# Patient Record
Sex: Male | Born: 1990 | Race: White | Hispanic: No | Marital: Single | Smoking: Never smoker
Health system: Southern US, Community
[De-identification: ages and names within clinical notes are randomized; demographics above are authoritative.]

---

## 2011-10-21 ENCOUNTER — Encounter (HOSPITAL_COMMUNITY): Payer: Self-pay

## 2011-10-21 ENCOUNTER — Emergency Department (HOSPITAL_COMMUNITY)
Admission: EM | Admit: 2011-10-21 | Discharge: 2011-10-21 | Disposition: A | Discharge status: Home / Self Care | Payer: Self-pay | Attending: Emergency Medicine | Admitting: Emergency Medicine

## 2011-10-21 DIAGNOSIS — L0390 Cellulitis, unspecified: Secondary | ICD-10-CM

## 2011-10-21 DIAGNOSIS — R21 Rash and other nonspecific skin eruption: Secondary | ICD-10-CM | POA: Insufficient documentation

## 2011-10-21 DIAGNOSIS — M7989 Other specified soft tissue disorders: Secondary | ICD-10-CM | POA: Insufficient documentation

## 2011-10-21 LAB — CBC WITH DIFFERENTIAL/PLATELET
Eosinophils Absolute: 0.2 10*3/uL (ref 0.0–0.7)
Lymphocytes Relative: 17 % (ref 12–46)
Lymphs Abs: 1.2 10*3/uL (ref 0.7–4.0)
Neutro Abs: 4.9 10*3/uL (ref 1.7–7.7)
Neutrophils Relative %: 69 % (ref 43–77)
Platelets: 228 10*3/uL (ref 150–400)
RBC: 5.07 MIL/uL (ref 4.22–5.81)
WBC: 7.2 10*3/uL (ref 4.0–10.5)

## 2011-10-21 LAB — COMPREHENSIVE METABOLIC PANEL
ALT: 26 U/L (ref 0–53)
Alkaline Phosphatase: 92 U/L (ref 39–117)
CO2: 25 mEq/L (ref 19–32)
GFR calc Af Amer: >90 mL/min (ref >90)
Glucose, Bld: 88 mg/dL (ref 70–99)
Potassium: 3.6 mEq/L (ref 3.5–5.1)
Sodium: 133 mEq/L — ABNORMAL LOW (ref 135–145)
Total Protein: 7.7 g/dL (ref 6.0–8.3)

## 2011-10-21 MED ORDER — VANCOMYCIN HCL IN DEXTROSE 1-5 GM/200ML-% IV SOLN
1000.0000 mg | Freq: Once | INTRAVENOUS | Status: AC
  Administered 2011-10-21: 1000 mg via INTRAVENOUS
  Filled 2011-10-21: qty 200

## 2011-10-21 MED ORDER — SULFAMETHOXAZOLE-TRIMETHOPRIM 800-160 MG PO TABS: 14.0000 | ORAL_TABLET | Freq: Two times a day (BID) | ORAL | Status: DC

## 2011-10-21 MED ORDER — SULFAMETHOXAZOLE-TRIMETHOPRIM 800-160 MG PO TABS: 1.0000 | ORAL_TABLET | Freq: Two times a day (BID) | ORAL | Status: AC

## 2011-10-21 NOTE — ED Notes (Signed)
Pt reports that he has been working out and now left leg is swollen x1 week

## 2011-10-21 NOTE — Discharge Instructions (Signed)
Follow up in 2-3 days for recheck °

## 2011-10-21 NOTE — ED Notes (Signed)
Pt c/o left leg pain after working out Thursday.

## 2011-10-21 NOTE — ED Provider Notes (Signed)
History  This chart was scribed for Thomas Lennert, MD by Thomas Coleman. This patient was seen in room APA07/APA07 and the patient's care was started at 0641.   CSN: [id number]  Arrival date & time 10/21/11  0641   None     Chief Complaint  Patient presents with  . Leg Swelling   Patient is a 21 y.o. male presenting with rash. The history is provided by the patient. No language interpreter was used.  Rash  This is a new problem. The current episode started more than 2 days ago. The problem has been gradually worsening. The problem is associated with nothing. There has been no fever. The rash is present on the left ankle and left lower leg. The pain is mild. The pain has been constant since onset. Pertinent negatives include no itching and no pain. He has tried nothing for the symptoms.   Thomas Coleman is a 21 y.o. male who presents to the Emergency Department complaining of constant gradually worsening non painful, erythematous, left lower leg swelling for 5 days. He is unsure if he was stung/bitten by anything but states he just began a new workout class about 5 days ago when his symptoms began. His swelling extends from below his knee with redness to his left ankle. He has not tried any medicines for this problem. Nothing seems to make it better or worse.   He has no PCP  History reviewed. No pertinent past medical history.  History reviewed. No pertinent past surgical history.  No family history on file.  History  Substance Use Topics  . Smoking status: Never Smoker   . Smokeless tobacco: Not on file  . Alcohol Use: No      Review of Systems  Constitutional: Negative for fever and chills.  Respiratory: Negative for shortness of breath.   Gastrointestinal: Negative for nausea and vomiting.  Skin: Positive for rash. Negative for itching.       Left lower leg swelling from knee to ankle  Neurological: Negative for weakness.  All other systems reviewed and are  negative.    Allergies  Review of patient's allergies indicates no known allergies.  Home Medications  No current outpatient prescriptions on file.  Triage Vitals: BP 126/83  Pulse 96  Temp 98.1 F (36.7 C) (Oral)  Resp 20  Ht 6\' 5"  (1.956 m)  SpO2 96%  Physical Exam  Nursing note and vitals reviewed. Constitutional: He is oriented to person, place, and time. He appears well-developed.  HENT:  Head: Normocephalic.  Eyes: Conjunctivae normal are normal.  Neck: Neck supple. No tracheal deviation present.  Cardiovascular:  No murmur heard. Pulmonary/Chest: Effort normal and breath sounds normal. No respiratory distress. He has no wheezes. He has no rales.  Musculoskeletal: Normal range of motion.  Neurological: He is oriented to person, place, and time.  Skin: Skin is warm. There is erythema.       Left lower leg below knee area about 5 cm in diameter with erythema, induration, and mildly tender. Swelling extending to lower part of tibular fibular region.     Psychiatric: He has a normal mood and affect. His behavior is normal.    ED Course  Procedures (including critical care time) DIAGNOSTIC STUDIES: Oxygen Saturation is 96% on room air, adequate by my interpretation.    COORDINATION OF CARE: At 755 AM Discussed treatment plan with patient which includes vancomycin, and blood work. Patient agrees.   Labs Reviewed - No data to display  No results found.   No diagnosis found.    MDM  The chart was scribed for me under my direct supervision.  I personally performed the history, physical, and medical decision making and all procedures in the evaluation of this patient.Thomas Lennert, MD 10/21/11 1003

## 2013-12-16 ENCOUNTER — Emergency Department (HOSPITAL_COMMUNITY): Payer: BC Managed Care – PPO

## 2013-12-16 ENCOUNTER — Emergency Department (HOSPITAL_COMMUNITY)
Admission: EM | Admit: 2013-12-16 | Discharge: 2013-12-16 | Disposition: A | Discharge status: Home / Self Care | Payer: BC Managed Care – PPO | Attending: Emergency Medicine | Admitting: Emergency Medicine

## 2013-12-16 ENCOUNTER — Encounter (HOSPITAL_COMMUNITY): Payer: Self-pay | Admitting: Emergency Medicine

## 2013-12-16 DIAGNOSIS — R002 Palpitations: Secondary | ICD-10-CM | POA: Diagnosis present

## 2013-12-16 DIAGNOSIS — Z792 Long term (current) use of antibiotics: Secondary | ICD-10-CM | POA: Diagnosis not present

## 2013-12-16 LAB — I-STAT CHEM 8, ED
BUN: 14 mg/dL (ref 6–23)
CHLORIDE: 102 meq/L (ref 96–112)
CREATININE: 0.8 mg/dL (ref 0.50–1.35)
Calcium, Ion: 1.17 mmol/L (ref 1.12–1.23)
GLUCOSE: 99 mg/dL (ref 70–99)
HEMATOCRIT: 47 % (ref 39.0–52.0)
HEMOGLOBIN: 16 g/dL (ref 13.0–17.0)
POTASSIUM: 4.1 meq/L (ref 3.7–5.3)
SODIUM: 139 meq/L (ref 137–147)
TCO2: 26 mmol/L (ref 0–100)

## 2013-12-16 LAB — RAPID URINE DRUG SCREEN, HOSP PERFORMED
Amphetamines: NOT DETECTED
Barbiturates: NOT DETECTED
Benzodiazepines: NOT DETECTED
COCAINE: NOT DETECTED
OPIATES: NOT DETECTED
Tetrahydrocannabinol: NOT DETECTED

## 2013-12-16 LAB — I-STAT TROPONIN, ED: Troponin i, poc: 0.01 ng/mL (ref 0.00–0.08)

## 2013-12-16 NOTE — ED Notes (Signed)
Pt reports palpitations since last night. Pt denies any cp,sob. nad noted.

## 2013-12-16 NOTE — Discharge Instructions (Signed)
°Emergency Department Resource Guide °1) Find a Doctor and Pay Out of Pocket °Although you won't have to find out who is covered by your insurance plan, it is a good idea to ask around and get recommendations. You will then need to call the office and see if the doctor you have chosen will accept you as a new patient and what types of options they offer for patients who are self-pay. Some doctors offer discounts or will set up payment plans for their patients who do not have insurance, but you will need to ask so you aren't surprised when you get to your appointment. ° °2) Contact Your Local Health Department °Not all health departments have doctors that can see patients for sick visits, but many do, so it is worth a call to see if yours does. If you don't know where your local health department is, you can check in your phone book. The CDC also has a tool to help you locate your state's health department, and many state websites also have listings of all of their local health departments. ° °3) Find a Walk-in Clinic °If your illness is not likely to be very severe or complicated, you may want to try a walk in clinic. These are popping up all over the country in pharmacies, drugstores, and shopping centers. They're usually staffed by nurse practitioners or physician assistants that have been trained to treat common illnesses and complaints. They're usually fairly quick and inexpensive. However, if you have serious medical issues or chronic medical problems, these are probably not your best option. ° °No Primary Care Doctor: °- Call [facility] at  832-8000 - they can help you locate a primary care doctor that  accepts your insurance, provides certain services, etc. °- Physician Referral Service- 1-800-533-3463 ° °Chronic Pain Problems: °Organization         Address  Phone   Notes  °[facility]  (336) 297-2271 Patients need to be referred by their primary care doctor.  ° °Medication  Assistance: °Organization         Address  Phone   Notes  °[place] [facility] [address] °[place], [place] [address] (336) 641-8030 --Must be a resident of [place] °-- Must have NO insurance coverage whatsoever (no Medicaid/ Medicare, etc.) °-- The pt. MUST have a primary care doctor that directs their care regularly and follows them in the community °  °[facility]  (866) 331-1348   °[employer]  (888) 892-1162   ° °Agencies that provide inexpensive medical care: °Organization         Address  Phone   Notes  °[facility]  (336) 832-8035   °[facility]    (336) 832-7272   °[facility] [address] °[place], [place] [address] (336) 832-4777   °[facility] [address], °[place] (336) 271-4999   °[facility]    (336) 373-0678   °[facility]    (336) 272-1050   °[facility] ° [address], [place] Phone:  (336) 832-4444, Fax:  (336) 832-4440 Hours of Operation:  9 am - 6 pm, M-F.  Also accepts Medicaid/Medicare and self-pay.  °[facility] ° [address], [place] Phone: (336) 832-3150, Fax: (336) 832-3151. Hours of Operation:  8:30 am - 5:30 pm, M-F.  Also accepts Medicaid and self-pay.  °[facility] [place] [address], [place] Phone: (336) 878-6027   °[facility] [address], [place], [place] (336)723-1848, Ext. 123 Mondays & Thursdays: 7-9 AM.  First 15 patients are seen on a first come, first serve basis. °  ° °Medicaid-accepting [place] Providers: ° °Organization         Address  Phone   Notes  °[facility] [address], [place] (336) 641-2100 Also accepts self-pay patients.  °[facility] [address], [place] ° (336) 856-9996   °[facility] [address], [place]  (336) 288-8857   °[facility] [address] [place] [address], [place] (336) 299-7000   °Veita Bland [address], [place]  ° (336) 373-1557 Only accepts [place] Access Medicaid patients after they have their name applied to their card.  ° °Self-Pay (no insurance) in [place]: ° °Organization         Address  Phone   Notes  °Sickle Cell Patients, [facility] [address], [place] (336) 832-1970   °[facility] [address], [place] (336) 832-4400   °[facility] ° [address], [place] (336) 992-4800   °[facility]/Dr. Osei-Bonsu ° [address] [place] [address], [place] or [address], [place] (336) 841-8500 Phone number for both [place] and [place] locations is the same.  °[facility] [address], [place] (336) 299-0000   °[facility] [address] [place] [address], [place] or [address] (336) 852-7530 °(336) 878-2260   °[facility] [address], [place] (336) 350-1642, phone; (336) 294-5005, fax Sees patients 1st and 3rd Saturday of every month.  Must not qualify for public or private insurance (i.e. Medicaid, Medicare, [place] Health Choice, Veterans' Benefits) • Household income should be no more than 200% of the poverty level •The clinic cannot treat you if you are pregnant or think you are pregnant • Sexually transmitted diseases are not treated at the clinic.  ° ° °Dental Care: °Organization         Address  Phone  Notes  °[place] [facility] [address], [place] (336) 641-6152 Accepts children up to age 21 who are enrolled in Medicaid or [place] Health Choice; pregnant women with a Medicaid card; and children who have applied for Medicaid or [place] Health Choice, but were declined, whose parents can pay a reduced fee at time of service.  °[place]  [facility] [place]  [address], [place] (336) 641-7733 Accepts children up to age 21 who are enrolled in Medicaid or [place] Health Choice; pregnant women with a Medicaid card; and children who have applied for Medicaid or [place] Health Choice, but were declined, whose parents can pay a reduced fee at time of service.  °[facility] ° [address], [place] (336) 641-4533 Patients are seen by appointment only. Walk-ins are not accepted. [facility] will see patients 18 years of age and older. °Monday - Tuesday (8am-5pm) °Most Wednesdays (8:30-5pm) °$30 per visit, cash only  °[facility] ° [address], [place] (336) 641-4533 Patients are seen by appointment only. Walk-ins are not accepted. [facility] will see patients 18 years of age and older. °One   Wednesday Evening (Monthly: Volunteer Based).  $30 per visit, cash only  °[facility]  (919) 537-3737 for adults; Children under age 4, call Graduate Pediatric Dentistry at (919) [phone]. Children aged 4-14, please call (919) [phone] to request a pediatric application. ° Dental services are provided in all areas of dental care including fillings, crowns and bridges, complete and partial dentures, implants, gum treatment, root canals, and extractions. Preventive care is also provided. Treatment is provided to both adults and children. °Patients are selected via a lottery and there is often a waiting list. °  °[facility] [address], °[place] ° (336) 763-8833 [email or web address] °  °[facility] [address], [place], [place] (336)723-1848, Ext. 123 Second and Fourth Thursday of each month, opens at 6:30 AM; Clinic ends at 9 AM.  Patients are seen on a first-come first-served basis, and a limited number are seen during each clinic.  ° °[facility] ° [address], [place], [place] (336) 723-7904    Eligibility Requirements °You must have lived in [place], [place], or [place] counties for at least the last three months. °  You cannot be eligible for state or federal sponsored healthcare insurance, including Veterans Administration, Medicaid, or Medicare. °  You generally cannot be eligible for healthcare insurance through your employer.  °  How to apply: °Eligibility screenings are held every Tuesday and Wednesday afternoon from 1:00 pm until 4:00 pm. You do not need an appointment for the interview!  °[facility] [address], [place], [place] 336-631-2330   °[facility]  336-342-8273   °[facility]  336-703-3100   °[facility]  336-570-6415   ° °Behavioral Health Resources in the Community: °Intensive Outpatient Programs °Organization         Address  Phone  Notes  °[facility] [address], [place], [place] 336-878-6098   °[facility] [address], [place], [place] 336-832-9800   °[facility] [address], [place], [place] ° 336-882-2125   °[facility] [address],  °[place], [place] 1-800-853-5163 or 336-641-4981   °Substance Abuse Resources °Organization         Address  Phone  Notes  °[facility]  336-882-2125   °[facility]  336-784-9470   °[facility]  336-285-9073   °[facility]  336-845-3988   °Residential & Outpatient Substance Abuse Program  1-800-659-3381   °Psychological Services °Organization         Address  Phone  Notes  °[facility]  336- 832-9600   °[facility]  336- 378-7881   °[facility] [address], [place] 1-800-853-5163 or 336-641-4981   ° °Mobile Crisis Teams °Organization         Address  Phone  Notes  °[facility], Mobile Crisis Care Unit  1-877-626-1772   °Assertive °[facility] ° [address]  [place], [place] 336-834-9664   °Sharon DeEsch [address] °[place] 336-554-5454   ° °Self-Help/Support Groups °Organization         Address  Phone             Notes  °[facility]  - variety of support groups  336- 373-1402 Call for more information  °Narcotics Anonymous (NA), [facility] [address], °[place]  2 meetings at this location  ° °  Residential Treatment Programs Organization         Address  Phone  Notes  [facility] [address],    [place]  [phone]   [facility]  [address], [place] [phone], [place], [place] [phone]   [facility] [address], [place] [phone] Admissions: 8am-3pm M-F  [facility] [address]    [place], [place] [phone]   [facility] [address], [place], [place] [phone]   [facility] [address]  [place], [place] [phone]   [facility] Intensive Outpatient [address], [place], [place] [phone]   [facility] ([facility] [address]  [place], [place] [phone]-(606)[phone] or [phone]   [facility] [address] [place], [place] [phone]) [phone] Accepts Medicaid  [facility] [address]  [place] [phone] Substance Abuse/Addiction Treatment   [facility]         Address  Phone  Notes  [facility]  (606) 233-6300(888) [phone]   Angie FavaJulie Brannon, PhD [address], [address] [place], [place]   [phone](336) (737)[phone] or [phone](336) [phone]   [facility]   [address] [place], [place] [phone](336) (925)[phone]   [facility] [address], [place], [place] [phone](336) (210) [phone] Insurance/Medicaid/sponsorship through [facility] [address]                                    [place], [place] [phone](336) (210) [phone] Therapy/tele-psych/case    [facility] [address]   [place], [place] ([phone]) [phone](336) ([phone]    Dr. Lolly MustacheArfeen  [phone](336) [phone]   [facility]  [facility] 1) [address], [place] 2) [address], [place] 3)  [address], [place] [phone](336) [phone](336) [phone]  [phone](336) [phone]   [facility] Child Abuse Hotline (445)390-0556(336) 817-086-4813 or (954)643-2603(336) (640)[phone] (After Hours)      Avoid avoid caffinated products, such as teas, colas, coffee, chocolate. Avoid over the counter cold medicines, herbal or "natural vitamin" products, and illicit drugs because they can contain stimulants. Try to take your heart rate when you have symptoms. Keep a diary of your symptoms and heart rate to show your doctor at your next office visit.  Call the Cardiologist on Monday to schedule a follow up appointment next week.  Return to the Emergency Department immediately if worsening.

## 2013-12-16 NOTE — ED Provider Notes (Signed)
CSN: [id number]     Arrival date & time 12/16/13  1357 History   First MD Initiated Contact with Patient 12/16/13 1458     Chief Complaint  Patient presents with  . Palpitations      HPI Pt was seen at 1505. Per pt, c/o gradual onset and persistence of multiple intermittent episodes of "palpitations" since yesterday. Pt states he drank a large pepsi max before going to bed last night, then felt "my heart speeding up and slowing down" after he laid down to try to sleep. Pt states this continued today [facility] States they told him his HR was "going from 80 to 120." Pt states was "feeling really anxious" at that time. States he feels "a little better" since coming to the ED for evaluation. Denies CP/SOB, no cough, no abd pain, no N/V/D, no back pain, no syncope/near syncope.    History reviewed. No pertinent past medical history.   History reviewed. No pertinent past surgical history.  History  Substance Use Topics  . Smoking status: Never Smoker   . Smokeless tobacco: Not on file  . Alcohol Use: No    Review of Systems ROS: Statement: All systems negative except as marked or noted in the HPI; Constitutional: Negative for fever and chills. ; ; Eyes: Negative for eye pain, redness and discharge. ; ; ENMT: Negative for ear pain, hoarseness, nasal congestion, sinus pressure and sore throat. ; ; Cardiovascular: +palpitations. Negative for chest pain, diaphoresis, dyspnea and peripheral edema. ; ; Respiratory: Negative for cough, wheezing and stridor. ; ; Gastrointestinal: Negative for nausea, vomiting, diarrhea, abdominal pain, blood in stool, hematemesis, jaundice and rectal bleeding. . ; ; Genitourinary: Negative for dysuria, flank pain and hematuria. ; ; Musculoskeletal: Negative for back pain and neck pain. Negative for swelling and trauma.; ; Skin: Negative for pruritus, rash, abrasions, blisters, bruising and skin lesion.; ; Neuro: Negative for headache,  lightheadedness and neck stiffness. Negative for weakness, altered level of consciousness , altered mental status, extremity weakness, paresthesias, involuntary movement, seizure and syncope.      Allergies  Review of patient's allergies indicates no known allergies.  Home Medications   Prior to Admission medications   Medication Sig Start Date End Date Taking? Authorizing Provider  OVER THE COUNTER MEDICATION Take 1 tablet by mouth daily as needed (Acid Reflux). OTC Acid Reflux Medication   Yes Historical Provider, MD  sulfamethoxazole-trimethoprim (SEPTRA DS) 800-160 MG per tablet Take 1 tablet by mouth every 12 (twelve) hours. Patient not taking: Reported on 12/16/2013 10/21/11   Benny LennertJoseph L Zammit, MD   BP 132/75 mmHg  Pulse 104  Temp(Src) 98.8 F (37.1 C) (Oral)  Resp 18  Ht 6\' 6"  (1.981 m)  Wt 520 lb (235.87 kg)  BMI 60.10 kg/m2  SpO2 98%  BP 118/73 mmHg  Pulse 98  Temp(Src) 97.9 F (36.6 C) (Oral)  Resp 18  Ht 6\' 6"  (1.981 m)  Wt 520 lb (235.87 kg)  BMI 60.10 kg/m2  SpO2 98%  Physical Exam 1510: Physical examination:  Nursing notes reviewed; Vital signs and O2 SAT reviewed;  Constitutional: Well developed, Well nourished, Well hydrated, In no acute distress; Head:  Normocephalic, atraumatic; Eyes: EOMI, PERRL, No scleral icterus; ENMT: Mouth and pharynx normal, Mucous membranes moist; Neck: Supple, Full range of motion, No lymphadenopathy; Cardiovascular: Tachycardic rate and rhythm, No gallop; Respiratory: Breath sounds clear & equal bilaterally, No wheezes.  Speaking full sentences with ease, Normal respiratory effort/excursion; Chest:  Nontender, Movement normal; Abdomen: Soft, Nontender, Obese. Nondistended, Normal bowel sounds; Genitourinary: No CVA tenderness; Extremities: Pulses normal, No tenderness, No edema, No calf edema or asymmetry.; Neuro: AA&Ox3, Major CN grossly intact.  Speech clear. No gross focal motor or sensory deficits in extremities. Climbs on and off  stretcher easily by himself. Gait steady.; Skin: Color normal, Warm, Dry.   ED Course  Procedures     EKG Interpretation None      MDM  MDM Reviewed: previous chart, nursing note and vitals Reviewed previous: labs Interpretation: labs, ECG and x-ray      Date: 12/16/2013  Rate: 96  Rhythm: normal sinus rhythm and sinus arrhythmia  QRS Axis: right  Intervals: normal  ST/T Wave abnormalities: normal  Conduction Disutrbances:none  Narrative Interpretation:   Old EKG Reviewed: none available.   Results for orders placed or performed during the hospital encounter of 12/16/13  Urine rapid drug screen (hosp performed)  Result Value Ref Range   Opiates NONE DETECTED NONE DETECTED   Cocaine NONE DETECTED NONE DETECTED   Benzodiazepines NONE DETECTED NONE DETECTED   Amphetamines NONE DETECTED NONE DETECTED   Tetrahydrocannabinol NONE DETECTED NONE DETECTED   Barbiturates NONE DETECTED NONE DETECTED  I-stat Chem 8, ED  Result Value Ref Range   Sodium 139 137 - 147 mEq/L   Potassium 4.1 3.7 - 5.3 mEq/L   Chloride 102 96 - 112 mEq/L   BUN 14 6 - 23 mg/dL   Creatinine, Ser 6.570.80 0.50 - 1.35 mg/dL   Glucose, Bld 99 70 - 99 mg/dL   Calcium, Ion 8.461.17 9.621.12 - 1.23 mmol/L   TCO2 26 0 - 100 mmol/L   Hemoglobin 16.0 13.0 - 17.0 g/dL   HCT 95.247.0 84.139.0 - 32.452.0 %  I-stat troponin, ED  Result Value Ref Range   Troponin i, poc 0.01 0.00 - 0.08 ng/mL   Comment 3           Dg Chest 2 View 12/16/2013   CLINICAL DATA:  Palpitations  EXAM: CHEST  2 VIEW  COMPARISON:  None  FINDINGS: Normal heart size, mediastinal contours, and pulmonary vascularity.  Lungs clear.  No pneumothorax.  Bones unremarkable.  IMPRESSION: Normal exam.   Electronically Signed   By: Ulyses SouthwardMark  Boles M.D.   On: 12/16/2013 16:10    1700:  Workup reassuring. Pt has ambulated with steady gait, easy resps, NAD. Pt states he "feels better" and wants to go home now. Will need f/u with Cards for Holter monitor. Dx and testing d/w  pt and family.  Questions answered.  Verb understanding, agreeable to d/c home with outpt f/u.   Samuel JesterKathleen Malasia Torain, DO 12/19/13 1555

## 2013-12-16 NOTE — ED Notes (Signed)
Pt denies chest pain

## 2015-08-23 IMAGING — CR DG CHEST 2V
2 series · 2 of 2 positions shown · non-contrast
Comparison: None

CLINICAL DATA: Palpitations

EXAM:
CHEST  2 VIEW

[view not recorded (1 of 2)]
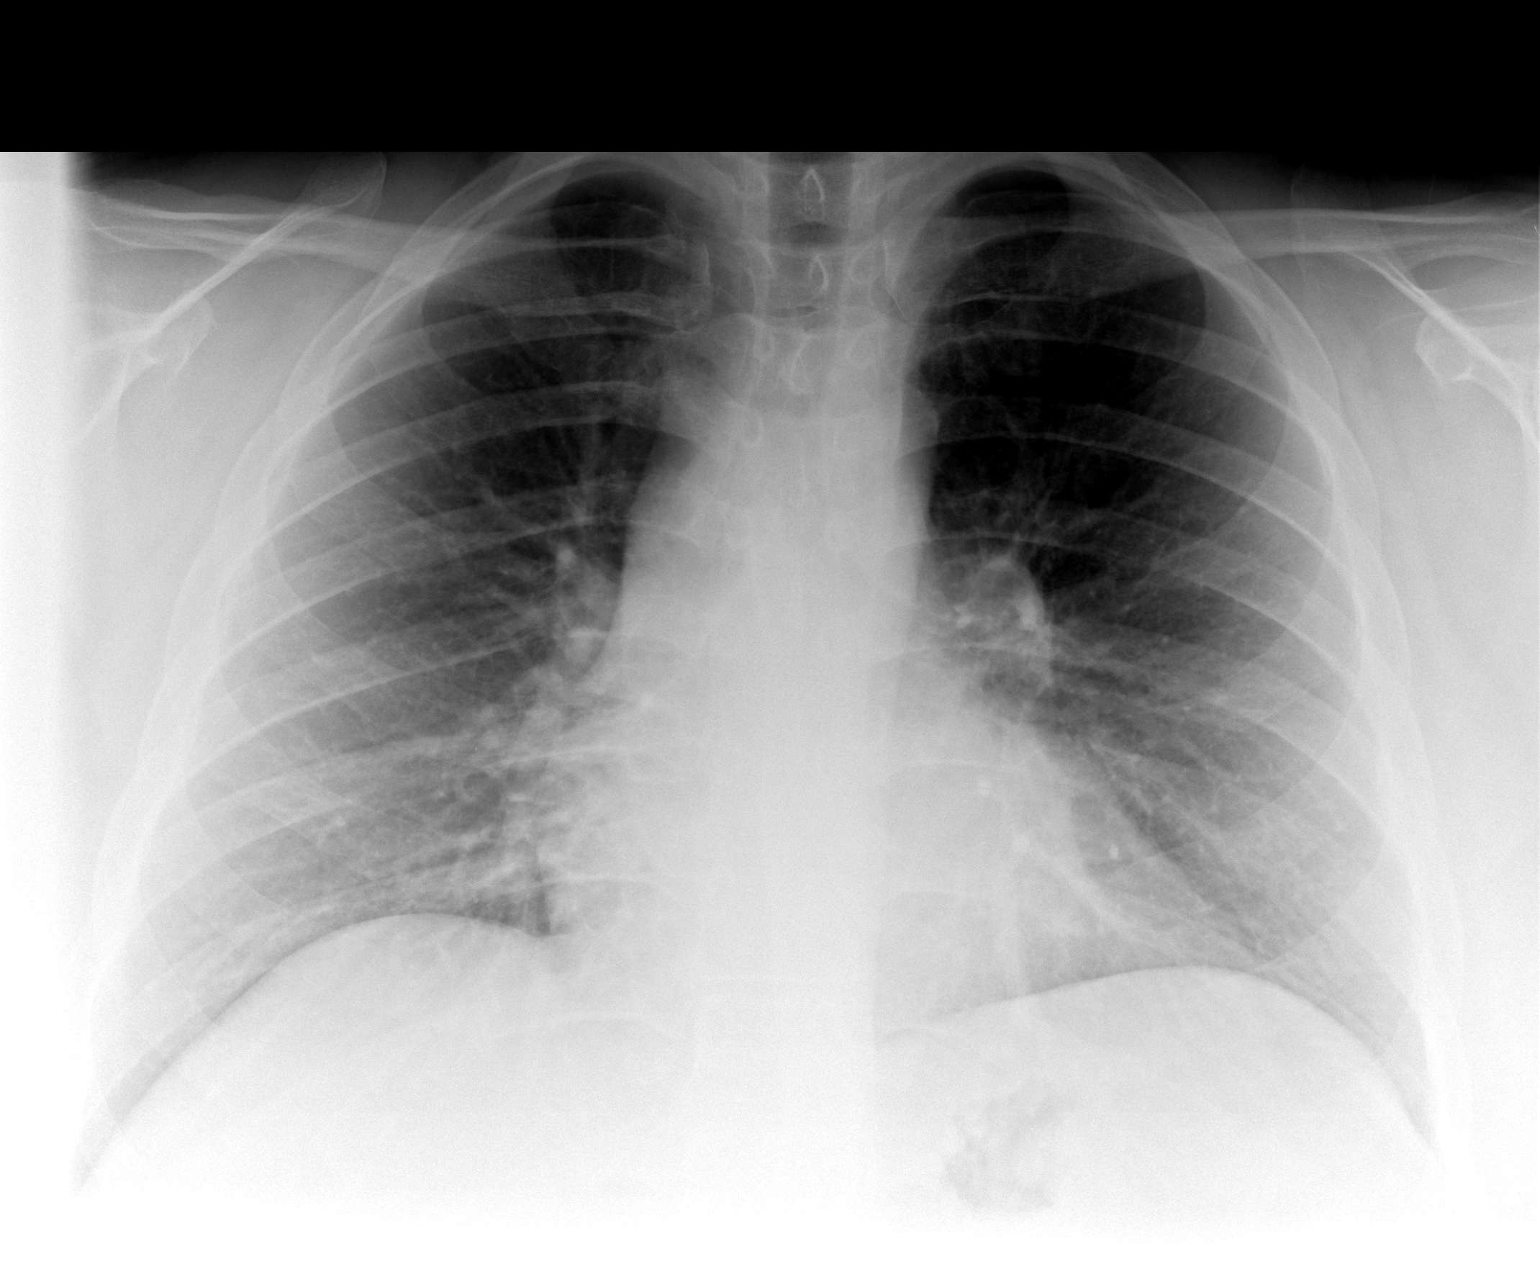

[view not recorded (2 of 2)]
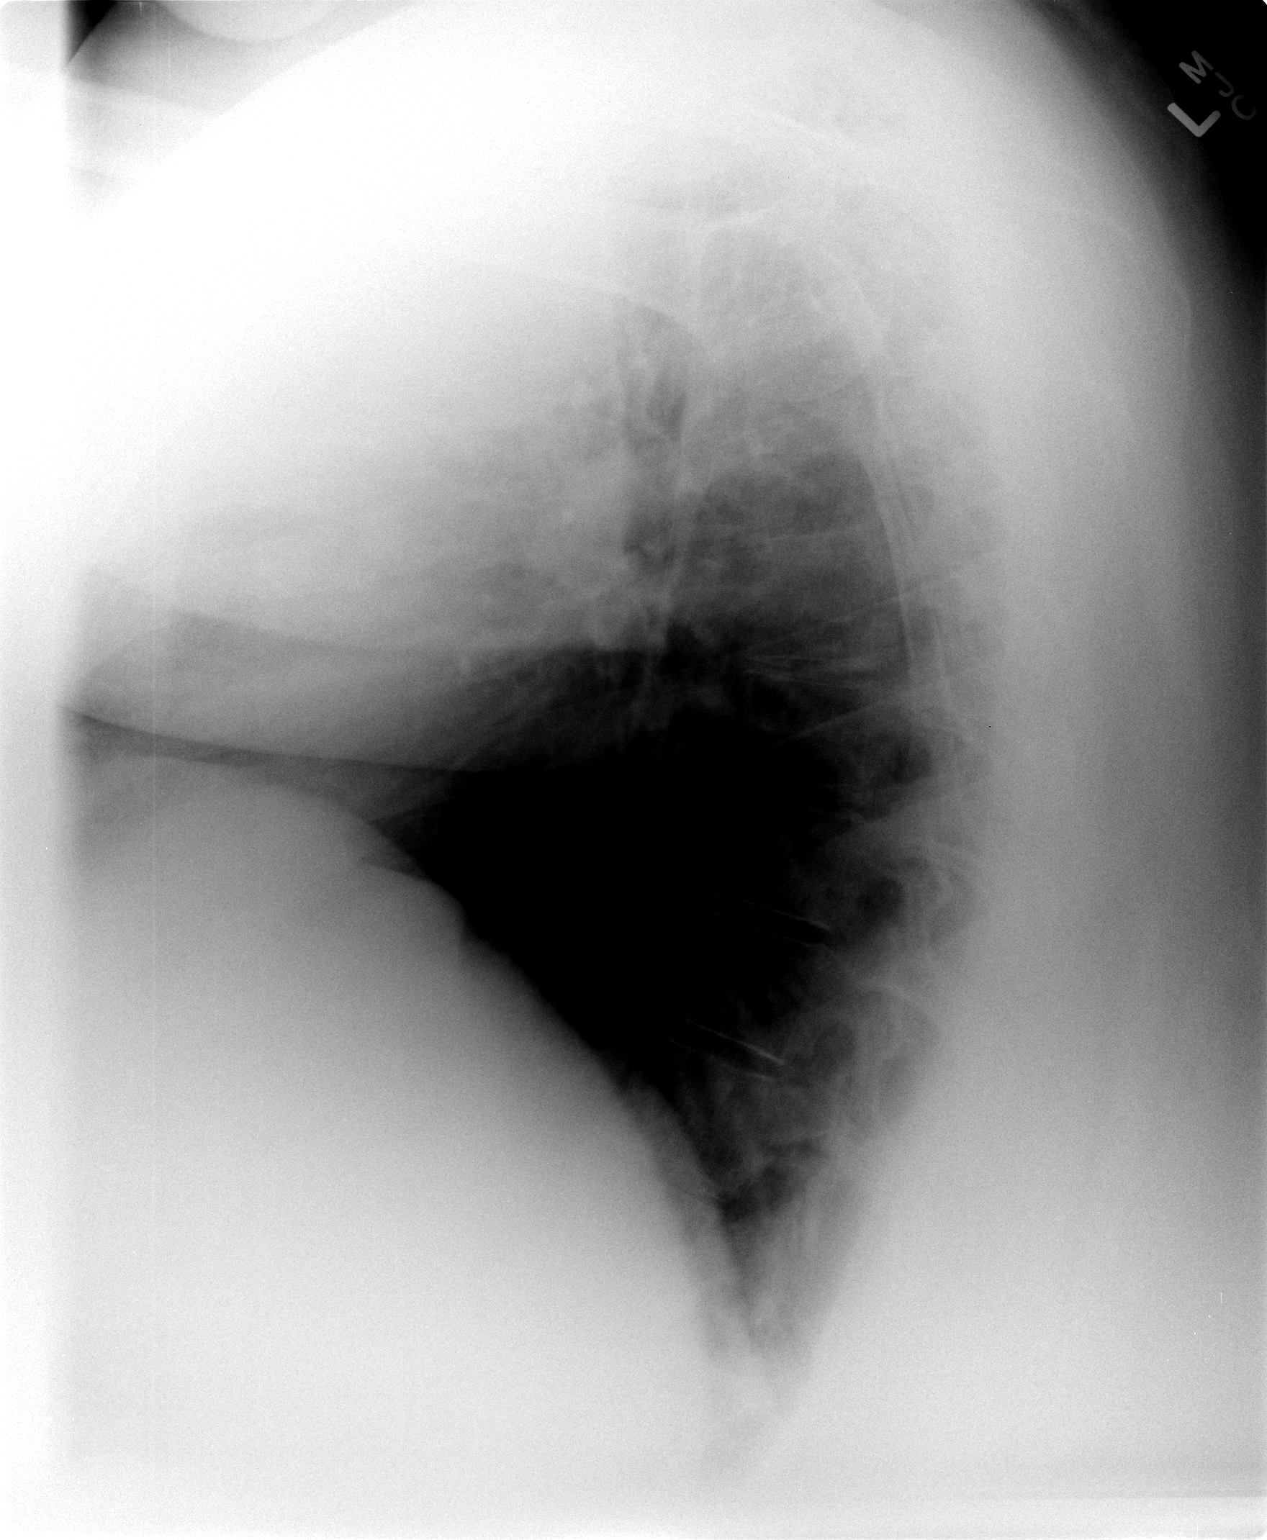

[2 of 2 positions shown; findings below may reference images not displayed]

FINDINGS: Normal heart size, mediastinal contours, and pulmonary vascularity.

Lungs clear.

No pneumothorax.

Bones unremarkable.
IMPRESSION: Normal exam.

## 2016-02-28 NOTE — Congregational Nurse Program (Signed)
Congregational Nurse Program Note  Date of Encounter: 02/28/2016  Past Medical History: No past medical history on file.  Encounter Details:     CNP Questionnaire - 02/28/16 1130      Patient Demographics   Is this a new or existing patient? New   Patient is considered a/an Not Applicable   Race Caucasian/White     Patient Assistance   Location of Patient [place]   Patient's financial/insurance status Self-Pay (Uninsured)   Uninsured Patient ([place] [employer]) No   Patient referred to apply for the following financial assistance Not Applicable   Food insecurities addressed Not Applicable   Transportation assistance No   Assistance securing medications No   Product/process development scientistducational health offerings Navigating the healthcare system;Acute disease     Encounter Details   Primary purpose of visit Acute Illness/Condition Visit;Navigating the Healthcare System   Was an Emergency Department visit averted? No   Does patient have a medical provider? No   Patient referred to [facility] Agency   Was a mental health screening completed? (GAINS tool) No   Does patient have dental issues? No   Does patient have vision issues? No   Does your patient have an abnormal blood pressure today? No   Since previous encounter, have you referred patient for abnormal blood pressure that resulted in a new diagnosis or medication change? No   Does your patient have an abnormal blood glucose today? No   Since previous encounter, have you referred patient for abnormal blood glucose that resulted in a new diagnosis or medication change? No   Was there a life-saving intervention made? No    Client was seen with issue with swelling in R. Arm and 4th and 5th fingers on R.hand, small blister noted on arm. Client denies pain, some itching. Client states has had swelling in left thumb in the past.Symptons went away. 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 Instructed to avoid any  lifting and to keep arm and hand elevatd with ice to decrease swelling. [facility]  Client states does not have any other health issues. BP today 116/86 P 111.  Pearletha AlfredJan Leveda Kendrix,RN  [phone].

## 2018-11-16 ENCOUNTER — Other Ambulatory Visit: Payer: Self-pay

## 2018-11-16 DIAGNOSIS — Z20822 Contact with and (suspected) exposure to covid-19: Secondary | ICD-10-CM

## 2018-11-18 LAB — NOVEL CORONAVIRUS, NAA: SARS-CoV-2, NAA: DETECTED — AB
